# Patient Record
Sex: Female | Born: 1940 | Race: White | Hispanic: No | Marital: Single | Smoking: Never smoker
Health system: Southern US, Community
[De-identification: ages and names within clinical notes are randomized; demographics above are authoritative.]

## PROBLEM LIST (undated history)

## (undated) DIAGNOSIS — I10 Essential (primary) hypertension: Secondary | ICD-10-CM

## (undated) DIAGNOSIS — M1990 Unspecified osteoarthritis, unspecified site: Secondary | ICD-10-CM

## (undated) DIAGNOSIS — E785 Hyperlipidemia, unspecified: Secondary | ICD-10-CM

## (undated) DIAGNOSIS — M3500 Sicca syndrome, unspecified: Secondary | ICD-10-CM

## (undated) HISTORY — PX: CHOLECYSTECTOMY: SHX55

## (undated) HISTORY — PX: ABDOMINAL HYSTERECTOMY: SHX81

## (undated) HISTORY — PX: CORONARY ANGIOPLASTY WITH STENT PLACEMENT: SHX49

---

## 1998-02-20 ENCOUNTER — Ambulatory Visit (HOSPITAL_COMMUNITY): Admission: RE | Admit: 1998-02-20 | Discharge: 1998-02-20 | Payer: Self-pay | Admitting: Obstetrics and Gynecology

## 1998-03-03 ENCOUNTER — Ambulatory Visit (HOSPITAL_COMMUNITY): Admission: RE | Admit: 1998-03-03 | Discharge: 1998-03-03 | Payer: Self-pay | Admitting: Obstetrics and Gynecology

## 2015-12-31 ENCOUNTER — Telehealth: Payer: Self-pay | Admitting: Allergy and Immunology

## 2015-12-31 NOTE — Telephone Encounter (Signed)
Error wrong patient

## 2015-12-31 NOTE — Telephone Encounter (Signed)
Pt called and need to speak to a nurse about the ventolin. [phone].

## 2016-04-24 ENCOUNTER — Encounter: Payer: Self-pay | Admitting: Emergency Medicine

## 2016-04-24 ENCOUNTER — Emergency Department: Payer: Medicare Other

## 2016-04-24 ENCOUNTER — Inpatient Hospital Stay
Admission: EM | Admit: 2016-04-24 | Discharge: 2016-04-27 | DRG: 872 | Disposition: A | Discharge status: Home / Self Care | Payer: Medicare Other | Attending: Internal Medicine | Admitting: Internal Medicine

## 2016-04-24 DIAGNOSIS — L559 Sunburn, unspecified: Secondary | ICD-10-CM | POA: Diagnosis present

## 2016-04-24 DIAGNOSIS — Z1611 Resistance to penicillins: Secondary | ICD-10-CM | POA: Diagnosis present

## 2016-04-24 DIAGNOSIS — M3500 Sicca syndrome, unspecified: Secondary | ICD-10-CM | POA: Diagnosis present

## 2016-04-24 DIAGNOSIS — R531 Weakness: Secondary | ICD-10-CM | POA: Diagnosis present

## 2016-04-24 DIAGNOSIS — A4151 Sepsis due to Escherichia coli [E. coli]: Principal | ICD-10-CM | POA: Diagnosis present

## 2016-04-24 DIAGNOSIS — A419 Sepsis, unspecified organism: Secondary | ICD-10-CM

## 2016-04-24 DIAGNOSIS — N10 Acute tubulo-interstitial nephritis: Secondary | ICD-10-CM | POA: Diagnosis present

## 2016-04-24 DIAGNOSIS — E785 Hyperlipidemia, unspecified: Secondary | ICD-10-CM | POA: Diagnosis present

## 2016-04-24 DIAGNOSIS — I10 Essential (primary) hypertension: Secondary | ICD-10-CM | POA: Diagnosis present

## 2016-04-24 DIAGNOSIS — Z955 Presence of coronary angioplasty implant and graft: Secondary | ICD-10-CM | POA: Diagnosis not present

## 2016-04-24 DIAGNOSIS — M1990 Unspecified osteoarthritis, unspecified site: Secondary | ICD-10-CM | POA: Diagnosis present

## 2016-04-24 DIAGNOSIS — N390 Urinary tract infection, site not specified: Secondary | ICD-10-CM | POA: Diagnosis present

## 2016-04-24 DIAGNOSIS — I2510 Atherosclerotic heart disease of native coronary artery without angina pectoris: Secondary | ICD-10-CM | POA: Diagnosis present

## 2016-04-24 DIAGNOSIS — D72829 Elevated white blood cell count, unspecified: Secondary | ICD-10-CM

## 2016-04-24 HISTORY — DX: Hyperlipidemia, unspecified: E78.5

## 2016-04-24 HISTORY — DX: Sjogren syndrome, unspecified: M35.00

## 2016-04-24 HISTORY — DX: Unspecified osteoarthritis, unspecified site: M19.90

## 2016-04-24 HISTORY — DX: Essential (primary) hypertension: I10

## 2016-04-24 LAB — CBC WITH DIFFERENTIAL/PLATELET
Basophils Absolute: 0.1 10*3/uL (ref 0–0.1)
Basophils Relative: 0 %
EOS ABS: 0.2 10*3/uL (ref 0–0.7)
EOS PCT: 1 %
HCT: 39.4 % (ref 35.0–47.0)
Hemoglobin: 14.1 g/dL (ref 12.0–16.0)
LYMPHS ABS: 2.2 10*3/uL (ref 1.0–3.6)
Lymphocytes Relative: 16 %
MCH: 32.1 pg (ref 26.0–34.0)
MCHC: 35.7 g/dL (ref 32.0–36.0)
MCV: 90.1 fL (ref 80.0–100.0)
MONOS PCT: 7 %
Monocytes Absolute: 1 10*3/uL — ABNORMAL HIGH (ref 0.2–0.9)
Neutro Abs: 9.8 10*3/uL — ABNORMAL HIGH (ref 1.4–6.5)
Neutrophils Relative %: 76 %
PLATELETS: 259 10*3/uL (ref 150–440)
RBC: 4.37 MIL/uL (ref 3.80–5.20)
RDW: 13.5 % (ref 11.5–14.5)
WBC: 13.2 10*3/uL — ABNORMAL HIGH (ref 3.6–11.0)

## 2016-04-24 LAB — URINALYSIS COMPLETE WITH MICROSCOPIC (ARMC ONLY)
BILIRUBIN URINE: NEGATIVE
GLUCOSE, UA: NEGATIVE mg/dL
KETONES UR: NEGATIVE mg/dL
NITRITE: POSITIVE — AB
Protein, ur: 30 mg/dL — AB
SPECIFIC GRAVITY, URINE: 1.009 (ref 1.005–1.030)
pH: 5 (ref 5.0–8.0)

## 2016-04-24 LAB — COMPREHENSIVE METABOLIC PANEL
ALT: 18 U/L (ref 14–54)
AST: 19 U/L (ref 15–41)
Albumin: 4.1 g/dL (ref 3.5–5.0)
Alkaline Phosphatase: 105 U/L (ref 38–126)
Anion gap: 10 (ref 5–15)
BUN: 16 mg/dL (ref 6–20)
CALCIUM: 9.1 mg/dL (ref 8.9–10.3)
CHLORIDE: 105 mmol/L (ref 101–111)
CO2: 25 mmol/L (ref 22–32)
CREATININE: 0.74 mg/dL (ref 0.44–1.00)
Glucose, Bld: 118 mg/dL — ABNORMAL HIGH (ref 65–99)
Potassium: 3.8 mmol/L (ref 3.5–5.1)
Sodium: 140 mmol/L (ref 135–145)
Total Bilirubin: 0.4 mg/dL (ref 0.3–1.2)
Total Protein: 7.8 g/dL (ref 6.5–8.1)

## 2016-04-24 LAB — LACTIC ACID, PLASMA
LACTIC ACID, VENOUS: 0.9 mmol/L (ref 0.5–1.9)
Lactic Acid, Venous: 0.9 mmol/L (ref 0.5–1.9)

## 2016-04-24 MED ORDER — ATORVASTATIN CALCIUM 20 MG PO TABS
40.0000 mg | ORAL_TABLET | Freq: Every day | ORAL | Status: DC
  Administered 2016-04-25 – 2016-04-26 (×2): 40 mg via ORAL
  Filled 2016-04-24 (×2): qty 2

## 2016-04-24 MED ORDER — PANTOPRAZOLE SODIUM 40 MG PO TBEC
40.0000 mg | DELAYED_RELEASE_TABLET | Freq: Every day | ORAL | Status: DC
  Administered 2016-04-25 – 2016-04-27 (×3): 40 mg via ORAL
  Filled 2016-04-24 (×3): qty 1

## 2016-04-24 MED ORDER — CYCLOSPORINE 0.05 % OP EMUL
1.0000 [drp] | Freq: Two times a day (BID) | OPHTHALMIC | Status: DC
  Administered 2016-04-24 – 2016-04-27 (×6): 1 [drp] via OPHTHALMIC
  Filled 2016-04-24 (×6): qty 1

## 2016-04-24 MED ORDER — SODIUM CHLORIDE 0.9% FLUSH
3.0000 mL | Freq: Two times a day (BID) | INTRAVENOUS | Status: DC
  Administered 2016-04-24 – 2016-04-27 (×3): 3 mL via INTRAVENOUS

## 2016-04-24 MED ORDER — HYDROCODONE-ACETAMINOPHEN 5-325 MG PO TABS
1.0000 | ORAL_TABLET | Freq: Once | ORAL | Status: AC
  Administered 2016-04-24: 1 via ORAL
  Filled 2016-04-24: qty 1

## 2016-04-24 MED ORDER — ONDANSETRON HCL 4 MG PO TABS
4.0000 mg | ORAL_TABLET | Freq: Four times a day (QID) | ORAL | Status: DC | PRN
  Administered 2016-04-25: 4 mg via ORAL
  Filled 2016-04-24: qty 1

## 2016-04-24 MED ORDER — DOCUSATE SODIUM 100 MG PO CAPS
100.0000 mg | ORAL_CAPSULE | Freq: Two times a day (BID) | ORAL | Status: DC
Start: 2016-04-24 — End: 2016-04-27
  Administered 2016-04-24 – 2016-04-27 (×6): 100 mg via ORAL
  Filled 2016-04-24 (×6): qty 1

## 2016-04-24 MED ORDER — BISACODYL 10 MG RE SUPP: 10.0000 mg | Freq: Every day | RECTAL | Status: DC | PRN

## 2016-04-24 MED ORDER — ASPIRIN EC 81 MG PO TBEC
81.0000 mg | DELAYED_RELEASE_TABLET | Freq: Every day | ORAL | Status: DC
  Administered 2016-04-25 – 2016-04-27 (×3): 81 mg via ORAL
  Filled 2016-04-24 (×3): qty 1

## 2016-04-24 MED ORDER — ACETAMINOPHEN 650 MG RE SUPP: 650.0000 mg | Freq: Four times a day (QID) | RECTAL | Status: DC | PRN

## 2016-04-24 MED ORDER — AMLODIPINE BESYLATE 5 MG PO TABS
5.0000 mg | ORAL_TABLET | Freq: Every day | ORAL | Status: DC
Start: 2016-04-25 — End: 2016-04-27
  Administered 2016-04-25 – 2016-04-27 (×3): 5 mg via ORAL
  Filled 2016-04-24 (×3): qty 1

## 2016-04-24 MED ORDER — METOPROLOL SUCCINATE ER 50 MG PO TB24
50.0000 mg | ORAL_TABLET | Freq: Every day | ORAL | Status: DC
  Administered 2016-04-25 – 2016-04-27 (×3): 50 mg via ORAL
  Filled 2016-04-24 (×3): qty 1

## 2016-04-24 MED ORDER — ACETAMINOPHEN 325 MG PO TABS
650.0000 mg | ORAL_TABLET | Freq: Once | ORAL | Status: AC | PRN
  Administered 2016-04-24: 650 mg via ORAL
  Filled 2016-04-24: qty 2

## 2016-04-24 MED ORDER — DEXTROSE 5 % IV SOLN
2.0000 g | Freq: Once | INTRAVENOUS | Status: AC
  Administered 2016-04-24: 2 g via INTRAVENOUS
  Filled 2016-04-24: qty 2

## 2016-04-24 MED ORDER — PILOCARPINE HCL 5 MG PO TABS
5.0000 mg | ORAL_TABLET | Freq: Three times a day (TID) | ORAL | Status: DC
  Administered 2016-04-24 – 2016-04-27 (×8): 5 mg via ORAL
  Filled 2016-04-24 (×10): qty 1

## 2016-04-24 MED ORDER — TICAGRELOR 90 MG PO TABS
90.0000 mg | ORAL_TABLET | Freq: Two times a day (BID) | ORAL | Status: DC
  Administered 2016-04-24 – 2016-04-27 (×6): 90 mg via ORAL
  Filled 2016-04-24 (×7): qty 1

## 2016-04-24 MED ORDER — ONDANSETRON HCL 4 MG/2ML IJ SOLN: 4.0000 mg | Freq: Four times a day (QID) | INTRAMUSCULAR | Status: DC | PRN

## 2016-04-24 MED ORDER — HEPARIN SODIUM (PORCINE) 5000 UNIT/ML IJ SOLN
5000.0000 [IU] | Freq: Three times a day (TID) | INTRAMUSCULAR | Status: DC
  Administered 2016-04-24 – 2016-04-27 (×8): 5000 [IU] via SUBCUTANEOUS
  Filled 2016-04-24 (×7): qty 1

## 2016-04-24 MED ORDER — SODIUM CHLORIDE 0.9 % IV SOLN
INTRAVENOUS | Status: DC
  Administered 2016-04-24 – 2016-04-26 (×6): via INTRAVENOUS

## 2016-04-24 MED ORDER — ACETAMINOPHEN 325 MG PO TABS
650.0000 mg | ORAL_TABLET | Freq: Four times a day (QID) | ORAL | Status: DC | PRN
  Administered 2016-04-25 – 2016-04-26 (×3): 650 mg via ORAL
  Filled 2016-04-24 (×3): qty 2

## 2016-04-24 MED ORDER — OXYCODONE HCL 5 MG PO TABS
5.0000 mg | ORAL_TABLET | ORAL | Status: DC | PRN
Start: 2016-04-24 — End: 2016-04-27
  Administered 2016-04-24 – 2016-04-26 (×4): 5 mg via ORAL
  Filled 2016-04-24 (×4): qty 1

## 2016-04-24 NOTE — ED Provider Notes (Signed)
[facility] Emergency Department Provider Note   ____________________________________________    I have reviewed the triage vital signs and the nursing notes.   HISTORY  Chief Complaint Fever and Sunburn     HPI Dawn Calderon is a 75 y.o. female who presents with fevers and chills. She also reports she experienced significant sunburn 3 days ago while at the beach. She first started developing chills yesterday which became much worse overnight. She denies dizziness. No nausea or vomiting. No cough. No shortness of breath. She is visiting family in the area   Past Medical History:  Diagnosis Date  . Arthritis   . Hyperlipemia   . Hypertension   . Sjoegren syndrome (HCC)     There are no active problems to display for this patient.   Past Surgical History:  Procedure Laterality Date  . ABDOMINAL HYSTERECTOMY    . CHOLECYSTECTOMY    . CORONARY ANGIOPLASTY WITH STENT PLACEMENT      Prior to Admission medications   Not on File     Allergies Doxycycline  No family history on file.  Social History Social History  Substance Use Topics  . Smoking status: Never Smoker  . Smokeless tobacco: Never Used  . Alcohol use No    Review of Systems  Constitutional: Severe chills Eyes: No visual changes.  ENT: No sore throat. Cardiovascular: Denies chest pain. Respiratory: Denies shortness of breath. No cough Gastrointestinal: No abdominal pain.  No nausea, no vomiting.   Genitourinary: Negative for dysuria. Musculoskeletal: Negative for back pain. Skin: Sunburn Neurological: Negative for headaches or weakness  10-point ROS otherwise negative.  ____________________________________________   PHYSICAL EXAM:  VITAL SIGNS: ED Triage Vitals  Enc Vitals Group     BP 04/24/16 1741 (!) 158/50     Pulse Rate 04/24/16 1741 93     Resp 04/24/16 1741 20     Temp 04/24/16 1741 (!) 101.9 F (38.8 C)     Temp Source 04/24/16 1741 Oral   SpO2 04/24/16 1741 98 %     Weight 04/24/16 1744 185 lb (83.9 kg)     Height 04/24/16 1744 5' 1.5" (1.562 m)     Head Circumference --      Peak Flow --      Pain Score 04/24/16 1745 8     Pain Loc --      Pain Edu? --      Excl. in GC? --     Constitutional: Alert and oriented. No acute distress. Pleasant and interactive Eyes: Conjunctivae are normal.  Head: Atraumatic. Nose: No congestion/rhinnorhea. Mouth/Throat: Mucous membranes are moist.   Neck:  Painless ROM Cardiovascular: Normal rate, regular rhythm. Grossly normal heart sounds.  Good peripheral circulation. Respiratory: Normal respiratory effort.  No retractions. Lungs CTAB. Gastrointestinal: Soft and nontender. No distention.  No CVA tenderness. Genitourinary: deferred Musculoskeletal: No lower extremity tenderness nor edema.  Warm and well perfused Neurologic:  Normal speech and language. No gross focal neurologic deficits are appreciated.  Skin:  Skin is warm, dry and intact. Erythema consistent with sunburn bilateral dorsal feet, bilateral shoulders and face, most severe on the feet. No blistering. Not consistent with cellulitis Psychiatric: Mood and affect are normal. Speech and behavior are normal.  ____________________________________________   LABS (all labs ordered are listed, but only abnormal results are displayed)  Labs Reviewed  COMPREHENSIVE METABOLIC PANEL - Abnormal; Notable for the following:       Result Value   Glucose, Bld 118 (*)  All other components within normal limits  URINALYSIS COMPLETEWITH MICROSCOPIC ([facility] ONLY) - Abnormal; Notable for the following:    Color, Urine YELLOW (*)    APPearance CLOUDY (*)    Hgb urine dipstick 2+ (*)    Protein, ur 30 (*)    Nitrite POSITIVE (*)    Leukocytes, UA 3+ (*)    Bacteria, UA FEW (*)    Squamous Epithelial / LPF 0-5 (*)    All other components within normal limits  CBC WITH DIFFERENTIAL/PLATELET - Abnormal; Notable for the following:     WBC 13.2 (*)    Neutro Abs 9.8 (*)    Monocytes Absolute 1.0 (*)    All other components within normal limits  URINE CULTURE  CULTURE, BLOOD (ROUTINE X 2)  CULTURE, BLOOD (ROUTINE X 2)  LACTIC ACID, PLASMA  LACTIC ACID, PLASMA   ____________________________________________  EKG  None ____________________________________________  RADIOLOGY Chest x-ray unremarkable ____________________________________________   PROCEDURES  Procedure(s) performed: No    Critical Care performed: No ____________________________________________   INITIAL IMPRESSION / ASSESSMENT AND PLAN / ED COURSE  Pertinent labs & imaging results that were available during my care of the patient were reviewed by me and considered in my medical decision making (see chart for details).  Patient presents with fever and sunburn.We will send urinalysis, check chest x-ray and labs to evaluate for cause of fever  Clinical Course  Urinalysis consistent with UTI, Rocephin ordered. Patient meets sepsis criteria given heart rate and fever and white blood cell count. Lactic acid is normal. I will admit to the hospitalist service ____________________________________________   FINAL CLINICAL IMPRESSION(S) / ED DIAGNOSES  Final diagnoses:  Sepsis, due to unspecified organism (HCC)  [facility])      NEW MEDICATIONS STARTED DURING THIS VISIT:  New Prescriptions   No medications on file     Note:  This document was prepared using Dragon voice recognition software and may include unintentional dictation errors.    Jene Everyobert Jacoria Keiffer, MD 04/24/16 [phone]

## 2016-04-24 NOTE — H&P (Signed)
History and Physical    Dawn GreenhouseLinda Scholer EAV:[id number] DOB: 11/05/1940 DOA: 04/24/2016  Referring physician: Dr. Cyril LoosenKinner PCP: No primary care provider on file.  Specialists: none  Chief Complaint: fever  HPI: Dawn Calderon is a 75 y.o. female has a past medical history significant for ASCVD and Sjogren's syndrome now here with fever and severe sunburn. Found to be febrile and tachycardic with an elevated WBC and abnormal UA c/w sepsis from a urinary source. She is now admitted. Also c/o severe foot pain due to sunburn. Had a coronary stent placed in December. Denies Cp or SOB.  Review of Systems: The patient denies anorexia,  weight loss,, vision loss, decreased hearing, hoarseness, chest pain, syncope, dyspnea on exertion, peripheral edema, balance deficits, hemoptysis, abdominal pain, melena, hematochezia, severe indigestion/heartburn, hematuria, incontinence, genital sores, muscle weakness, suspicious skin lesions, transient blindness, difficulty walking, depression, unusual weight change, abnormal bleeding, enlarged lymph nodes, angioedema, and breast masses.   Past Medical History:  Diagnosis Date  . Arthritis   . Hyperlipemia   . Hypertension   . Sjoegren syndrome [facility](HCC)    Past Surgical History:  Procedure Laterality Date  . ABDOMINAL HYSTERECTOMY    . CHOLECYSTECTOMY    . CORONARY ANGIOPLASTY WITH STENT PLACEMENT     Social History:  reports that she has never smoked. She has never used smokeless tobacco. She reports that she does not drink alcohol or use drugs.  Allergies  Allergen Reactions  . Doxycycline Hives    History reviewed. No pertinent family history.  Prior to Admission medications   Not on File   Physical Exam: Vitals:   04/24/16 1741 04/24/16 1744  BP: (!) 158/50   Pulse: 93   Resp: 20   Temp: (!) 101.9 F (38.8 C)   TempSrc: Oral   SpO2: 98%   Weight:  83.9 kg (185 lb)  Height:  5' 1.5" (1.562 m)     General:  No apparent distress, WDWN,  Henning/AT  Eyes: PERRL, EOMI, no scleral icterus, conjunctiva clear  ENT: moist oropharynx without exudate, TM's benign, dentition good  Neck: supple, no lymphadenopathy. No bruits or thyromegaly  Cardiovascular: regular rate without MRG; 2+ peripheral pulses, no JVD, no peripheral edema  Respiratory: CTA biL, good air movement without wheezing, rhonchi or crackled. Respiratory effort normal  Abdomen: soft, non tender to palpation, positive bowel sounds, no guarding, no rebound  Skin: no rashes or lesions  Musculoskeletal: normal bulk and tone, no joint swelling  Psychiatric: normal mood and affect, A&OX3  Neurologic: CN 2-12 grossly intact, Motor strength 5/5 in all 4 groups with symmetric DTR's and non-focal sensory exam  Labs on Admission:  Basic Metabolic Panel:  Recent Labs Lab 04/24/16 1752  NA 140  K 3.8  CL 105  CO2 25  GLUCOSE 118*  BUN 16  CREATININE 0.74  CALCIUM 9.1   Liver Function Tests:  Recent Labs Lab 04/24/16 1752  AST 19  ALT 18  ALKPHOS 105  BILITOT 0.4  PROT 7.8  ALBUMIN 4.1   No results for input(s): LIPASE, AMYLASE in the last 168 hours. No results for input(s): AMMONIA in the last 168 hours. CBC:  Recent Labs Lab 04/24/16 1752  WBC 13.2*  NEUTROABS 9.8*  HGB 14.1  HCT 39.4  MCV 90.1  PLT 259   Cardiac Enzymes: No results for input(s): CKTOTAL, CKMB, CKMBINDEX, TROPONINI in the last 168 hours.  BNP (last 3 results) No results for input(s): BNP in the last 8760 hours.  ProBNP (  last 3 results) No results for input(s): PROBNP in the last 8760 hours.  CBG: No results for input(s): GLUCAP in the last 168 hours.  Radiological Exams on Admission: Dg Chest 2 View  Result Date: 04/24/2016 CLINICAL DATA:  Fever, chills starting yesterday EXAM: CHEST  2 VIEW COMPARISON:  None. FINDINGS: Cardiomediastinal silhouette is unremarkable. Streaky bilateral basilar atelectasis or scarring. No infiltrate or pulmonary edema. Osteopenia and  degenerative changes thoracic spine. Atherosclerotic calcifications of thoracic aorta. IMPRESSION: No infiltrate or pulmonary edema. Streaky bilateral basilar atelectasis or scarring. Degenerative changes thoracic spine. Electronically Signed   By: Natasha MeadLiviu  Pop M.D.   On: 04/24/2016 18:55    EKG: Independently reviewed.  Assessment/Plan Principal Problem:   Sepsis (HCC) Active Problems:   UTI (lower urinary tract infection)   ASCVD (arteriosclerotic cardiovascular disease)   Sunburn   Will admit to floor with IV fluids and IV ABX. Cultures sent. Pain meds as needed. Repeat labs in AM. Continue outpatient meds  Diet: heart healthy Fluids: NS@100  DVT Prophylaxis: SQ Heparin  Code Status: FULL  Family Communication: none  Disposition Plan: home  Time spent: 50 min

## 2016-04-24 NOTE — ED Triage Notes (Signed)
Pt reports fever since Friday night. Temp of 101 at home. Pt also c/o chills. Pt also states she was at the beach the week and has redness and from sunburns to chest and top of both feet.

## 2016-04-25 LAB — BLOOD CULTURE ID PANEL (REFLEXED)
Acinetobacter baumannii: NOT DETECTED
CANDIDA GLABRATA: NOT DETECTED
CANDIDA KRUSEI: NOT DETECTED
CANDIDA TROPICALIS: NOT DETECTED
CARBAPENEM RESISTANCE: NOT DETECTED
Candida albicans: NOT DETECTED
Candida parapsilosis: NOT DETECTED
ESCHERICHIA COLI: DETECTED — AB
Enterobacter cloacae complex: NOT DETECTED
Enterobacteriaceae species: DETECTED — AB
Enterococcus species: NOT DETECTED
HAEMOPHILUS INFLUENZAE: NOT DETECTED
Klebsiella oxytoca: NOT DETECTED
Klebsiella pneumoniae: NOT DETECTED
LISTERIA MONOCYTOGENES: NOT DETECTED
METHICILLIN RESISTANCE: NOT DETECTED
Neisseria meningitidis: NOT DETECTED
PROTEUS SPECIES: NOT DETECTED
Pseudomonas aeruginosa: NOT DETECTED
SERRATIA MARCESCENS: NOT DETECTED
STREPTOCOCCUS PYOGENES: NOT DETECTED
Staphylococcus aureus (BCID): NOT DETECTED
Staphylococcus species: NOT DETECTED
Streptococcus agalactiae: NOT DETECTED
Streptococcus pneumoniae: NOT DETECTED
Streptococcus species: NOT DETECTED
Vancomycin resistance: NOT DETECTED

## 2016-04-25 LAB — COMPREHENSIVE METABOLIC PANEL
ALBUMIN: 3.6 g/dL (ref 3.5–5.0)
ALK PHOS: 95 U/L (ref 38–126)
ALT: 17 U/L (ref 14–54)
AST: 18 U/L (ref 15–41)
Anion gap: 5 (ref 5–15)
BILIRUBIN TOTAL: 0.5 mg/dL (ref 0.3–1.2)
BUN: 11 mg/dL (ref 6–20)
CALCIUM: 8.7 mg/dL — AB (ref 8.9–10.3)
CO2: 26 mmol/L (ref 22–32)
CREATININE: 0.67 mg/dL (ref 0.44–1.00)
Chloride: 108 mmol/L (ref 101–111)
GFR calc Af Amer: >60 mL/min (ref >60)
GLUCOSE: 105 mg/dL — AB (ref 65–99)
Potassium: 4.1 mmol/L (ref 3.5–5.1)
Sodium: 139 mmol/L (ref 135–145)
TOTAL PROTEIN: 6.9 g/dL (ref 6.5–8.1)

## 2016-04-25 LAB — CBC
HEMATOCRIT: 37.6 % (ref 35.0–47.0)
Hemoglobin: 12.6 g/dL (ref 12.0–16.0)
MCH: 30.8 pg (ref 26.0–34.0)
MCHC: 33.6 g/dL (ref 32.0–36.0)
MCV: 91.8 fL (ref 80.0–100.0)
PLATELETS: 214 10*3/uL (ref 150–440)
RBC: 4.1 MIL/uL (ref 3.80–5.20)
RDW: 13.6 % (ref 11.5–14.5)
WBC: 12.2 10*3/uL — ABNORMAL HIGH (ref 3.6–11.0)

## 2016-04-25 MED ORDER — CALAMINE EX LOTN
TOPICAL_LOTION | CUTANEOUS | Status: DC | PRN
Start: 2016-04-25 — End: 2016-04-27
  Filled 2016-04-25: qty 118

## 2016-04-25 MED ORDER — SILVER SULFADIAZINE 1 % EX CREA
TOPICAL_CREAM | CUTANEOUS | Status: DC | PRN
  Administered 2016-04-25 – 2016-04-26 (×5): via TOPICAL
  Filled 2016-04-25: qty 85

## 2016-04-25 MED ORDER — IBUPROFEN 400 MG PO TABS
400.0000 mg | ORAL_TABLET | Freq: Once | ORAL | Status: DC
  Filled 2016-04-25: qty 1

## 2016-04-25 MED ORDER — SODIUM CHLORIDE 0.9 % IV SOLN
1.0000 g | Freq: Three times a day (TID) | INTRAVENOUS | Status: DC
  Administered 2016-04-25 – 2016-04-27 (×6): 1 g via INTRAVENOUS
  Filled 2016-04-25 (×9): qty 1

## 2016-04-25 NOTE — Progress Notes (Signed)
Pharmacy Antibiotic Note  Dawn GreenhouseLinda Calderon is a 75 y.o. female admitted on 04/24/2016 with bacteremia.  Pharmacy has been consulted for meropenem dosing.  Plan: PHARMACY - PHYSICIAN COMMUNICATION CRITICAL VALUE ALERT - BLOOD CULTURE IDENTIFICATION (BCID)  Results for orders placed or performed during the hospital encounter of 04/24/16  Blood Culture ID Panel (Reflexed) (Collected: 04/24/2016  5:56 PM)  Result Value Ref Range   Enterococcus species NOT DETECTED NOT DETECTED   Vancomycin resistance NOT DETECTED NOT DETECTED   Listeria monocytogenes NOT DETECTED NOT DETECTED   Staphylococcus species NOT DETECTED NOT DETECTED   Staphylococcus aureus NOT DETECTED NOT DETECTED   Methicillin resistance NOT DETECTED NOT DETECTED   Streptococcus species NOT DETECTED NOT DETECTED   Streptococcus agalactiae NOT DETECTED NOT DETECTED   Streptococcus pneumoniae NOT DETECTED NOT DETECTED   Streptococcus pyogenes NOT DETECTED NOT DETECTED   Acinetobacter baumannii NOT DETECTED NOT DETECTED   Enterobacteriaceae species DETECTED (A) NOT DETECTED   Enterobacter cloacae complex NOT DETECTED NOT DETECTED   Escherichia coli DETECTED (A) NOT DETECTED   Klebsiella oxytoca NOT DETECTED NOT DETECTED   Klebsiella pneumoniae NOT DETECTED NOT DETECTED   Proteus species NOT DETECTED NOT DETECTED   Serratia marcescens NOT DETECTED NOT DETECTED   Carbapenem resistance NOT DETECTED NOT DETECTED   Haemophilus influenzae NOT DETECTED NOT DETECTED   Neisseria meningitidis NOT DETECTED NOT DETECTED   Pseudomonas aeruginosa NOT DETECTED NOT DETECTED   Candida albicans NOT DETECTED NOT DETECTED   Candida glabrata NOT DETECTED NOT DETECTED   Candida krusei NOT DETECTED NOT DETECTED   Candida parapsilosis NOT DETECTED NOT DETECTED   Candida tropicalis NOT DETECTED NOT DETECTED    Name of physician (or Provider) Contacted: Dr. Winona LegatoVaickute  Changes to prescribed antibiotics required: ceftriaxone was changed to meropenem [facility]  [facility]  Further narrowing will be determined based on finalized susceptibilities.   Dawn Calderon, PharmD Pharmacy Resident 04/25/2016 9:22 AM

## 2016-04-25 NOTE — Progress Notes (Signed)
18:15 Patient temp 101.7. Tylenol given,.

## 2016-04-25 NOTE — Progress Notes (Signed)
[facility]   PATIENT NAME: Dawn GreenhouseLinda Calderon    MR#:  [id number]  DATE OF BIRTH:  09/27/1940  SUBJECTIVE:  CHIEF COMPLAINT:   Chief Complaint  Patient presents with  . Fever  . Sunburn  The patient is 75 year old Caucasian female who is visiting with her family in the area who presents to the hospital with pain in dorsal aspect of the feet due to sunburn, fevers, chills. She was noted to have fever to 103, leukocytosis, pyuria, concerning for urinary tract infection. Patient was initiated on Rocephin and admitted to the hospital for further evaluation. Blood cultures revealed Enterobacter/Escherichia coli. Patient was changed to meropenem today due to high frequency of ESBL Escherichia coli in the area. She feels better today, she was just initiated on ointment for sunburn  Review of Systems  Constitutional: Negative for chills, fever and weight loss.  HENT: Negative for congestion.   Eyes: Negative for blurred vision and double vision.  Respiratory: Negative for cough, sputum production, shortness of breath and wheezing.   Cardiovascular: Negative for chest pain, palpitations, orthopnea, leg swelling and PND.  Gastrointestinal: Negative for abdominal pain, blood in stool, constipation, diarrhea, nausea and vomiting.  Genitourinary: Negative for dysuria, frequency, hematuria and urgency.  Musculoskeletal: Negative for falls.  Neurological: Negative for dizziness, tremors, focal weakness and headaches.  Endo/Heme/Allergies: Does not bruise/bleed easily.  Psychiatric/Behavioral: Negative for depression. The patient does not have insomnia.     VITAL SIGNS: Blood pressure 110/87, pulse 77, temperature 99.1 F (37.3 C), temperature source Oral, resp. rate 20, height 5' 1.5" (1.562 m), weight 89 kg (196 lb 4.8 oz), SpO2 95 %.  PHYSICAL EXAMINATION:   GENERAL:  75 y.o.-year-old patient lying in the bed with no acute distress.  EYES: Pupils  equal, round, reactive to light and accommodation. No scleral icterus. Extraocular muscles intact.  HEENT: Head atraumatic, normocephalic. Oropharynx and nasopharynx clear.  NECK:  Supple, no jugular venous distention. No thyroid enlargement, no tenderness.  LUNGS: Normal breath sounds bilaterally, no wheezing, rales,rhonchi or crepitation. No use of accessory muscles of respiration.  CARDIOVASCULAR: S1, S2 normal. No murmurs, rubs, or gallops.  ABDOMEN: Soft, nontender, nondistended. Bowel sounds present. No organomegaly or mass.  EXTREMITIES: No pedal edema, cyanosis, or clubbing.  NEUROLOGIC: Cranial nerves II through XII are intact. Muscle strength 5/5 in all extremities. Sensation intact. Gait not checked.  PSYCHIATRIC: The patient is alert and oriented x 3.  SKIN: No obvious rash, lesion, or ulcer. Skin reveals significant severe sunburn in thighs anteriorly,  Anterior chest also bilateral dorsal aspect of the feet  ORDERS/RESULTS REVIEWED:   CBC  Recent Labs Lab 04/24/16 1752 04/25/16 0419  WBC 13.2* 12.2*  HGB 14.1 12.6  HCT 39.4 37.6  PLT 259 214  MCV 90.1 91.8  MCH 32.1 30.8  MCHC 35.7 33.6  RDW 13.5 13.6  LYMPHSABS 2.2  --   MONOABS 1.0*  --   EOSABS 0.2  --   BASOSABS 0.1  --    ------------------------------------------------------------------------------------------------------------------  Chemistries   Recent Labs Lab 04/24/16 1752 04/25/16 0419  NA 140 139  K 3.8 4.1  CL 105 108  CO2 25 26  GLUCOSE 118* 105*  BUN 16 11  CREATININE 0.74 0.67  CALCIUM 9.1 8.7*  AST 19 18  ALT 18 17  ALKPHOS 105 95  BILITOT 0.4 0.5   ------------------------------------------------------------------------------------------------------------------ estimated creatinine clearance is 64.3 mL/min (by C-G formula based on SCr of 0.8 mg/dL). ------------------------------------------------------------------------------------------------------------------ No  results for  input(s): TSH, T4TOTAL, T3FREE, THYROIDAB in the last 72 hours.  Invalid input(s): FREET3  Cardiac Enzymes No results for input(s): CKMB, TROPONINI, MYOGLOBIN in the last 168 hours.  Invalid input(s): CK ------------------------------------------------------------------------------------------------------------------ Invalid input(s): POCBNP ---------------------------------------------------------------------------------------------------------------  RADIOLOGY: Dg Chest 2 View  Result Date: 04/24/2016 CLINICAL DATA:  Fever, chills starting yesterday EXAM: CHEST  2 VIEW COMPARISON:  None. FINDINGS: Cardiomediastinal silhouette is unremarkable. Streaky bilateral basilar atelectasis or scarring. No infiltrate or pulmonary edema. Osteopenia and degenerative changes thoracic spine. Atherosclerotic calcifications of thoracic aorta. IMPRESSION: No infiltrate or pulmonary edema. Streaky bilateral basilar atelectasis or scarring. Degenerative changes thoracic spine. Electronically Signed   By: Natasha MeadLiviu  Pop M.D.   On: 04/24/2016 18:55    EKG: No orders found for this or any previous visit.  ASSESSMENT AND PLAN:  Principal Problem:   Sepsis (HCC) Active Problems:   UTI (lower urinary tract infection)   ASCVD (arteriosclerotic cardiovascular disease)   Sunburn #1. Escherichia coli sepsis due to urinary tract infection, suspected, urine culture is pending, continue patient on meropenem, awaiting for culture sensitivity #2. Urinary tract infection, as above,  Urinary cultures are pending, suspected Escherichia coli, continue antibiotic therapy intravenously, changed to oral whenever sensitivities are back #3. Leukocytosis, no significant improvement with Rocephin, now on meropenem, following the morning #4 sunburn, continue Silvadene daily cream, patient feels more comfortable   Management plans discussed with the patient, family and they are in agreement.   DRUG ALLERGIES:  Allergies   Allergen Reactions  . Doxycycline Other (See Comments)    Burn and itch in vagina area.    CODE STATUS:     Code Status Orders        Start     Ordered   04/24/16 2202  Full code  Continuous     04/24/16 2201    Code Status History    Date Active Date Inactive Code Status Order ID Comments User Context   This patient has a current code status but no historical code status.      TOTAL TIME TAKING CARE OF THIS PATIENT: 40 minutes.    Katharina CaperVAICKUTE,Estevan Kersh M.D on 04/25/2016 at 2:39 PM  Between 7am to 6pm - Pager - [phone]  After 6pm go to [email or web address] - password [email or web address] [facility]  [facility]  Office  [phone]  CC: Primary care physician; No primary care provider on file.

## 2016-04-25 NOTE — Progress Notes (Signed)
17:50 Patient spent day in room - up to bathroom without problem. No complaints re- voiding.IV Antibiotics - Merepenum given.  VS stable. Tele - SR with occasional PVC. Edward JollySilva dine applied to sunburnt areas x2 today. Patient states she feels much improved. No distress noted.

## 2016-04-25 NOTE — Progress Notes (Signed)
Pt febrile 101.5. Notified Dr Elisabeth PigeonVachhani. Received order for ibuprofen 400 mg x 1

## 2016-04-25 NOTE — Progress Notes (Signed)
08:00 Patient very upset this morning - states she was admitted with sunburn, however has not received any treatment for it. Dr. Seth BakeV called, silvadene ointment ordered.

## 2016-04-26 LAB — CBC
HEMATOCRIT: 36.2 % (ref 35.0–47.0)
Hemoglobin: 12.3 g/dL (ref 12.0–16.0)
MCH: 31 pg (ref 26.0–34.0)
MCHC: 34 g/dL (ref 32.0–36.0)
MCV: 91.1 fL (ref 80.0–100.0)
Platelets: 232 10*3/uL (ref 150–440)
RBC: 3.98 MIL/uL (ref 3.80–5.20)
RDW: 13.5 % (ref 11.5–14.5)
WBC: 12.6 10*3/uL — ABNORMAL HIGH (ref 3.6–11.0)

## 2016-04-26 MED ORDER — CALAMINE EX LOTN
TOPICAL_LOTION | Freq: Three times a day (TID) | CUTANEOUS | Status: DC
  Administered 2016-04-26 – 2016-04-27 (×4): via TOPICAL
  Filled 2016-04-26: qty 118

## 2016-04-26 NOTE — Progress Notes (Signed)
Initial Nutrition Assessment  DOCUMENTATION CODES:   Obesity unspecified  INTERVENTION:  -Monitor intake and cater to pt preferences -Discussed diet restrictions briefly. Pt aware of diet restrictions and has met with and RD at cardiac rehab in the past   NUTRITION DIAGNOSIS:    (none at this time) related to   as evidenced by  .    GOAL:   Patient will meet greater than or equal to 90% of their needs    MONITOR:   PO intake  REASON FOR ASSESSMENT:   Malnutrition Screening Tool    ASSESSMENT:      Pt admitted with sunburn feet, fever, chills found to have UTI. Pt reports having heart stents placed in Dec 2016   Past Medical History:  Diagnosis Date  . Arthritis   . Hyperlipemia   . Hypertension   . Sjoegren syndrome ([place])    Pt reports good appetite prior to admission.  " I just came from the beach with my family and ate way to much." " I didn't follow my diet on vacation."  Medications reviewed: colace, NS at 164m/hr  Labs reviewed: glucose 105   Diet Order:  Diet Heart Room service appropriate? Yes; Fluid consistency: Thin  Skin:  Reviewed, no issues  Last BM:  8/21  Height:   Ht Readings from Last 1 Encounters:  04/24/16 5' 1.5" (1.562 m)    Weight: pt reports wt loss but intentional. Eating less and exercising after going to cardiac rehab.  Wt Readings from Last 1 Encounters:  04/24/16 196 lb 4.8 oz (89 kg)    Ideal Body Weight:     BMI:  Body mass index is 36.49 kg/m.  Estimated Nutritional Needs:   Kcal:  1300-1600 kcals/d  Protein:  89-107 g/d  Fluid:  >/=1600 ml/d  EDUCATION NEEDS:   Education needs addressed  Phu Record B. [place](pager) Weekend/On-Call pager ([phone]

## 2016-04-26 NOTE — Progress Notes (Signed)
[facility] -  at [facility]   PATIENT NAME: Dawn GreenhouseLinda Calderon    MR#:  [id number]  DATE OF BIRTH:  01/16/1941  SUBJECTIVE:  CHIEF COMPLAINT:   Chief Complaint  Patient presents with  . Fever  . Sunburn  The patient is 75 year old Caucasian female who is visiting with her family in the area who presents to the hospital with pain in dorsal aspect of the feet due to sunburn, fevers, chills. She was noted to have fever to 103, leukocytosis, pyuria, concerning for urinary tract infection. Patient was initiated on Rocephin and admitted to the hospital for further evaluation. Blood cultures revealed Enterobacter/Escherichia coli. Patient was changed to meropenem today due to high frequency of ESBL Escherichia coli in the area. Patient feels better today, still weak. She is afebrile. Urine, and the blood cultures are still pending. Patient is on meropenem.  Review of Systems  Constitutional: Negative for chills, fever and weight loss.  HENT: Negative for congestion.   Eyes: Negative for blurred vision and double vision.  Respiratory: Negative for cough, sputum production, shortness of breath and wheezing.   Cardiovascular: Negative for chest pain, palpitations, orthopnea, leg swelling and PND.  Gastrointestinal: Negative for abdominal pain, blood in stool, constipation, diarrhea, nausea and vomiting.  Genitourinary: Negative for dysuria, frequency, hematuria and urgency.  Musculoskeletal: Negative for falls.  Neurological: Negative for dizziness, tremors, focal weakness and headaches.  Endo/Heme/Allergies: Does not bruise/bleed easily.  Psychiatric/Behavioral: Negative for depression. The patient does not have insomnia.     VITAL SIGNS: Blood pressure (!) 123/48, pulse 67, temperature 98.3 F (36.8 C), temperature source Oral, resp. rate 19, height 5' 1.5" (1.562 m), weight 89 kg (196 lb 4.8 oz), SpO2 96 %.  PHYSICAL EXAMINATION:   GENERAL:  75 y.o.-year-old  patient lying in the bed with no acute distress.  EYES: Pupils equal, round, reactive to light and accommodation. No scleral icterus. Extraocular muscles intact.  HEENT: Head atraumatic, normocephalic. Oropharynx and nasopharynx clear.  NECK:  Supple, no jugular venous distention. No thyroid enlargement, no tenderness.  LUNGS: Normal breath sounds bilaterally, no wheezing, rales,rhonchi or crepitation. No use of accessory muscles of respiration.  CARDIOVASCULAR: S1, S2 normal. No murmurs, rubs, or gallops.  ABDOMEN: Soft, nontender, nondistended. Bowel sounds present. No organomegaly or mass.  EXTREMITIES: No pedal edema, cyanosis, or clubbing.  NEUROLOGIC: Cranial nerves II through XII are intact. Muscle strength 5/5 in all extremities. Sensation intact. Gait not checked.  PSYCHIATRIC: The patient is alert and oriented x 3.  SKIN: No obvious rash, lesion, or ulcer. Skin reveals significant severe sunburn in thighs anteriorly,  Anterior chest also bilateral dorsal aspect of the feet  ORDERS/RESULTS REVIEWED:   CBC  Recent Labs Lab 04/24/16 1752 04/25/16 0419 04/26/16 0424  WBC 13.2* 12.2* 12.6*  HGB 14.1 12.6 12.3  HCT 39.4 37.6 36.2  PLT 259 214 232  MCV 90.1 91.8 91.1  MCH 32.1 30.8 31.0  MCHC 35.7 33.6 34.0  RDW 13.5 13.6 13.5  LYMPHSABS 2.2  --   --   MONOABS 1.0*  --   --   EOSABS 0.2  --   --   BASOSABS 0.1  --   --    ------------------------------------------------------------------------------------------------------------------  Chemistries   Recent Labs Lab 04/24/16 1752 04/25/16 0419  NA 140 139  K 3.8 4.1  CL 105 108  CO2 25 26  GLUCOSE 118* 105*  BUN 16 11  CREATININE 0.74 0.67  CALCIUM 9.1 8.7*  AST 19 18  ALT 18 17  ALKPHOS 105 95  BILITOT 0.4 0.5   ------------------------------------------------------------------------------------------------------------------ estimated creatinine clearance is 64.3 mL/min (by C-G formula based on SCr of 0.8  mg/dL). ------------------------------------------------------------------------------------------------------------------ No results for input(s): TSH, T4TOTAL, T3FREE, THYROIDAB in the last 72 hours.  Invalid input(s): FREET3  Cardiac Enzymes No results for input(s): CKMB, TROPONINI, MYOGLOBIN in the last 168 hours.  Invalid input(s): CK ------------------------------------------------------------------------------------------------------------------ Invalid input(s): POCBNP ---------------------------------------------------------------------------------------------------------------  RADIOLOGY: Dg Chest 2 View  Result Date: 04/24/2016 CLINICAL DATA:  Fever, chills starting yesterday EXAM: CHEST  2 VIEW COMPARISON:  None. FINDINGS: Cardiomediastinal silhouette is unremarkable. Streaky bilateral basilar atelectasis or scarring. No infiltrate or pulmonary edema. Osteopenia and degenerative changes thoracic spine. Atherosclerotic calcifications of thoracic aorta. IMPRESSION: No infiltrate or pulmonary edema. Streaky bilateral basilar atelectasis or scarring. Degenerative changes thoracic spine. Electronically Signed   By: Natasha MeadLiviu  Pop M.D.   On: 04/24/2016 18:55    EKG: No orders found for this or any previous visit.  ASSESSMENT AND PLAN:  Principal Problem:   Sepsis (HCC) Active Problems:   UTI (lower urinary tract infection)   ASCVD (arteriosclerotic cardiovascular disease)   Sunburn #1. Escherichia coli sepsis due to urinary tract infection,  urine culture reveals Escherichia coli as well, continue patient on meropenem, awaiting for culture sensitivity #2. Urinary tract infection due to  Escherichia coli, continue meropenem therapy intravenously, changed to oral whenever sensitivities are back #3. Leukocytosis, no significant improvement with meropenem, following as outpatient #4 sunburn, continue Silvadene daily cream. Add calamine, patient feels more comfortable today 5.  Generalized weakness, physical therapist evaluation is pending   Management plans discussed with the patient, family and they are in agreement.   DRUG ALLERGIES:  Allergies  Allergen Reactions  . Doxycycline Other (See Comments)    Burn and itch in vagina area.    CODE STATUS:     Code Status Orders        Start     Ordered   04/24/16 2202  Full code  Continuous     04/24/16 2201    Code Status History    Date Active Date Inactive Code Status Order ID Comments User Context   This patient has a current code status but no historical code status.      TOTAL TIME TAKING CARE OF THIS PATIENT: 40 minutes.    Katharina CaperVAICKUTE,Vinton Layson M.D on 04/26/2016 at 2:15 PM  Between 7am to 6pm - Pager - [phone]  After 6pm go to [email or web address] - password [email or web address] [facility]  [facility]  Office  [phone]  CC: Primary care physician; No primary care provider on file.

## 2016-04-26 NOTE — Evaluation (Signed)
Physical Therapy Evaluation Patient Details Name: Dawn Calderon MRN: [id number] DOB: 05/06/1941 Today's Date: 04/26/2016   History of Present Illness  Pt is a 75 yr old female presenting with fever, chills, and sunburn to the chest and dorsal surface of bilat feet. Admitted with sepsis 2/2 UTI. PMH significant for HTN, HLD, arthritis, ASCVD s/p stent placement 12/16.   Clinical Impression  Prior to admission, pt was independent with all functional mobility and ADLs.  Pt lives out of town, and is currently staying with family who live in a two-story home with 3 STE (bedroom/bathroom on 2nd level).  Currently, pt demonstrates independence versus supervision (for IV pole management) with all observed functional mobility, including ambulation x24950ft and navigation of 8 steps.  Pt scored 28/28 on the Tinetti Balance assessment and 12/12 on the modified DGI, indicating she is at a low risk for falls.  Pt is near baseline for functional mobility, limited only by bilat foot pain 2/2 sunburn, and has no acute PT needs. Safe to d/c home when medically appropriate. Will d/c pt in house; please re-consult if pt's status/needs should change.     Follow Up Recommendations No PT follow up    Equipment Recommendations  None recommended by PT    Recommendations for Other Services       Precautions / Restrictions Precautions Precautions: None Restrictions Weight Bearing Restrictions: No      Mobility  Bed Mobility General bed mobility comments: Received in chair; returned to chair. Not assessed.  Transfers Overall transfer level: Independent Equipment used: None General transfer comment: Pt able to perform several repetitions sit <> stand transfers with and without UE assist. No physical assist or cueing required.  Ambulation/Gait Ambulation/Gait assistance: Supervision Ambulation Distance (Feet): 250 Feet Assistive device: None Gait Pattern/deviations: Antalgic Gait velocity: 1.43  ft/sec General Gait Details: Pt ambulates with short, slow, shuffle steps 2/2 bilat feet pain d/t sunburn. No physical assist, verbal cueing, or DME required. Supervision for IV pole management.  Stairs Stairs: Yes Stairs assistance: Supervision Stair Management: Two rails;Step to pattern;Alternating pattern Number of Stairs: 8 General stair comments: Pt demonstrates ability to ascend/descend 8 steps using fluctuating pattern alternating vs step to. Supervision for safety/line management, no physical assist or vc's requried.   Wheelchair Mobility    Modified Rankin (Stroke Patients Only)       Balance Overall balance assessment: Modified Independent Tinetti Balance Assessment was administered and pt scored as follows: Balance sub scale:  16/16 Gait sub scale:  12/12 Total Score:  28/28  No deficits observed  MODIFIED DYNAMIC GAIT INDEX was administered and pt scored as follows: Gait level surface:              3/3 Change in gait speed:   3/3 Gait with horizontal head turns: 3/3 Gait with vertical head turns:  3/3 Total Score:    12/12          Pertinent Vitals/Pain Pain Assessment: 0-10 Pain Score: 8  Pain Location: bilat feet; dorsal surface Pain Descriptors / Indicators: Burning Pain Intervention(s): Limited activity within patient's tolerance;Monitored during session;Repositioned  HR and O2 monitored throughout session and maintained WFL.     Home Living Family/patient expects to be discharged to:: Private residence Living Arrangements: Children Available Help at Discharge: Family Type of Home: House Home Access: Stairs to enter Entrance Stairs-Rails: None Entrance Stairs-Number of Steps: 3 Home Layout: Two level Alternate level stairs- rails: can reach both Alternate level stairs- number of steps: 12 Home Equipment: Dan HumphreysWalker -  2 wheels Additional Comments: Pt from out of town, staying with family until 8/26. Home set-up as detailed above is for the home she  is staying in here. Her permanent home is a Warden/ranger[place]-entry, one story apartment where she lives with her daughter.     Prior Function Level of Independence: Independent  Comments: Pt underwent cardiac rehab 3x/wk x12wks at the beginning of the year, has maintained exercise 3-4x/wk since. Very active, drives.     Extremity/TrunAssessment   Upper Extremity Assessment: Overall WFL for tasks assessed  Lower Extremity Assessment: Overall WFL for tasks assessed  Cervical / Trunk Assessment: Normal    Communication   Communication: No difficulties  Cognition Arousal/Alertness: Awake/alert Behavior During Therapy: WFL for tasks assessed/performed Overall Cognitive Status: Within Functional Limits for tasks assessed    General Comments General comments (skin integrity, edema, etc.): Redness noted on dorsal aspect of bilat feet. Pt willing to don bariatric-sized socks for ambulation.    Exercises Other Exercises Other Exercises: Pt ambulated 27150ft around unit with head turns in all directions and variable gait speed. Supervision for IV pole management.      Assessment/Plan    PT Assessment Patent does not need any further PT services  PT Diagnosis     PT Problem List    PT Treatment Interventions     PT Goals (Current goals can be found in the Care Plan section) Acute Rehab PT Goals Patient Stated Goal: To go home PT Goal Formulation: With patient Time For Goal Achievement: 05/10/16 Potential to Achieve Goals: Good    Frequency     Barriers to discharge           End of Session Equipment Utilized During Treatment: Gait belt Activity Tolerance: Patient tolerated treatment well Patient left: in chair;with call bell/phone within reach (per RN, no chair alarm necessary) Nurse Communication: Mobility status;Precautions         Time: [id number] PT Time Calculation (min) (ACUTE ONLY): 24 min   Charges:         PT G Codes:        Baljit Liebert, SPT 04/26/2016, 12:59  PM

## 2016-04-27 DIAGNOSIS — D72829 Elevated white blood cell count, unspecified: Secondary | ICD-10-CM

## 2016-04-27 DIAGNOSIS — R531 Weakness: Secondary | ICD-10-CM

## 2016-04-27 LAB — URINE CULTURE

## 2016-04-27 LAB — CULTURE, BLOOD (ROUTINE X 2)

## 2016-04-27 MED ORDER — SULFAMETHOXAZOLE-TRIMETHOPRIM 800-160 MG PO TABS: 1.0000 | ORAL_TABLET | Freq: Two times a day (BID) | ORAL | 0 refills | Status: DC

## 2016-04-27 MED ORDER — SILVER SULFADIAZINE 1 % EX CREA: TOPICAL_CREAM | CUTANEOUS | 0 refills | Status: DC | PRN

## 2016-04-27 MED ORDER — SULFAMETHOXAZOLE-TRIMETHOPRIM 800-160 MG PO TABS: 1.0000 | ORAL_TABLET | Freq: Two times a day (BID) | ORAL | 0 refills | Status: AC

## 2016-04-27 MED ORDER — SILVER SULFADIAZINE 1 % EX CREA: TOPICAL_CREAM | CUTANEOUS | 0 refills | Status: AC | PRN

## 2016-04-27 NOTE — Care Management Note (Signed)
Case Management Note  Patient Details  Name: Dawn GreenhouseLinda Seelbach MRN: [id number] Date of Birth: 07/23/1941  Subjective/Objective:     Spoke with patient who is Alert Oriented independent with daughter.   Up walking in room  Without difficulty. Patient is very active and travels frequently. No DME.  Daughter is present to drive patient. Patient plans to return back to her home in [place].  No CM needs Identified.            Action/Plan: Home with self care   Expected Discharge Date:  04/26/16               Expected Discharge Plan:  Home/Self Care  In-House Referral:     Discharge planning Services  CM Consult  Post Acute Care Choice:    Choice offered to:  NA  DME Arranged:    DME Agency:     HH Arranged:    HH Agency:     Status of Service:  Completed, signed off  If discussed at [employer] Length of Stay Meetings, dates discussed:    Additional Comments:  Adonis HugueninBerkhead, Tinamarie Przybylski L, RN 04/27/2016, 12:21 PM

## 2016-04-27 NOTE — Care Management Important Message (Signed)
Important Message  Patient Details  Name: Dawn Calderon MRN: [id number] Date of Birth: 09/06/1940   Medicare Important Message Given:  Yes    Adonis HugueninBerkhead, Terrence Wishon L, RN 04/27/2016, 9:56 AM

## 2016-04-27 NOTE — Discharge Summary (Signed)
[facility] - [place] at [facility]   PATIENT NAME: Dawn Calderon    MR#:  [id number]  DATE OF BIRTH:  1940/11/07  DATE OF ADMISSION:  04/24/2016 ADMITTING PHYSICIAN: Marguarite Arbour, MD  DATE OF DISCHARGE: 04/27/2016 11:15 AM  PRIMARY CARE PHYSICIAN: No primary care provider on file.     ADMISSION DIAGNOSIS:  UTI (lower urinary tract infection) [N39.0] Sepsis, due to unspecified organism (HCC) [A41.9]  DISCHARGE DIAGNOSIS:  Principal Problem:   Escherichia coli sepsis (HCC) Active Problems:   Acute pyelonephritis   Leukocytosis   Generalized weakness   ASCVD (arteriosclerotic cardiovascular disease)   Sunburn   SECONDARY DIAGNOSIS:   Past Medical History:  Diagnosis Date  . Arthritis   . Hyperlipemia   . Hypertension   . Sjoegren syndrome (HCC)     .pro HOSPITAL COURSE:   The patient is 75 year old Caucasian female who is visiting with her family in the area who presents to the hospital with pain in dorsal aspect of the feet due to sunburn, fevers, chills. She was noted to have fever to 103, leukocytosis, pyuria, concerning for urinary tract infection. Patient was initiated on Rocephin and admitted to the hospital for further evaluation. Blood cultures revealed Enterobacter/Escherichia coli. Patient was changed to meropenem today due to high frequency of ESBL Escherichia coli in the area. Sensitivities of Escherichia coli showed, the bacteria is resistant to ampicillin, ampicillin, sulbactam, cefazolin, intermediately sensitive to Pepto tazobactam, sensitive to all other antibiotics including Septra DS. Patient was managed on meropenem while she was in the hospital, however, discharged on Septra DS to complete course. For Sunburn, she was given Silvadene cream with improvement of her condition. She was stable to be discharged home/relative's house in [place]. Discussion by problem: #1. Escherichia coli sepsis due to acute pyelonephritis, blood and   urine culture revealed Escherichia coli , . Patient was continued on meropenem, while in the hospital, she is being discharged on Septra DS, to be taken for 11 more days to complete course  #2. Urinary tract infection due to  Escherichia continue Septra DS, symptoms resolved 3. Leukocytosis,  following as outpatient for complete resolution. #4 sunburn, continue Silvadene daily cream. The  patient is more comfortable today 5. Generalized weakness, physical thevaluated patient and recommended no physical therapist follow-up    DRUG ALLERGIES:   Allergies  Allergen Reactions  . Doxycycline Other (See Comments)    Burn and itch in vagina area.    DISCHARGE MEDICATIONS:   Discharge Medication List as of 04/27/2016 10:19 AM    CONTINUE these medications which have CHANGED   Details  silver sulfADIAZINE (SILVADENE) 1 % cream Apply topically as needed (as needed)., Starting Tue 04/27/2016, Print    sulfamethoxazole-trimethoprim (BACTRIM DS,SEPTRA DS) 800-160 MG tablet Take 1 tablet by mouth 2 (two) times daily., Starting Tue 04/27/2016, Print      CONTINUE these medications which have NOT CHANGED   Details  amLODipine (NORVASC) 5 MG tablet Take 5 mg by mouth every morning., Starting Thu 03/18/2016, Historical Med    aspirin EC 81 MG tablet Take 81 mg by mouth at bedtime., Historical Med    AZASITE 1 % ophthalmic solution Place 1 drop into both eyes every morning., Starting Tue 04/20/2016, Historical Med    BRILINTA 90 MG TABS tablet Take 90 mg by mouth 2 (two) times daily., Starting Fri 04/02/2016, Historical Med    diclofenac sodium (VOLTAREN) 1 % GEL Apply 1 application topically 4 (four) times daily as needed  for pain., Starting Tue 04/20/2016, Historical Med    HYDROcodone-acetaminophen (NORCO) 7.5-325 MG tablet Take 1 tablet by mouth every 8 (eight) hours as needed for pain., Starting Sat 04/03/2016, Historical Med    metoprolol succinate (TOPROL-XL) 50 MG 24 hr tablet Take 50 mg by  mouth every morning., Starting Tue 04/06/2016, Historical Med    metroNIDAZOLE (METROGEL) 0.75 % gel Apply 1 application topically 2 (two) times daily. *Apply to face*, Starting Fri 04/02/2016, Historical Med    pantoprazole (PROTONIX) 40 MG tablet Take 40 mg by mouth every morning., Starting Tue 04/20/2016, Historical Med    pilocarpine (SALAGEN) 5 MG tablet Take 5 mg by mouth 3 (three) times daily., Starting Tue 04/20/2016, Historical Med    RESTASIS 0.05 % ophthalmic emulsion Place 1 drop into both eyes 3 (three) times daily., Starting Tue 04/20/2016, Historical Med    atorvastatin (LIPITOR) 80 MG tablet Take 80 mg by mouth at bedtime., Starting Tue 04/06/2016, Historical Med         The The patient is to follow-up with her primary care physician within 1-2 weeks after dischargepatient is to follow-up with his   If you experience worsening of your admission symptoms, develop shortness of breath, life threatening emergency, suicidal or homicidal thoughts you must seek medical attention immediately by calling 911 or calling your MD immediately  if symptoms less severe.  You Must read complete instructions/literature along with all the possible adverse reactions/side effects for all the Medicines you take and that have been prescribed to you. Take any new Medicines after you have completely understood and accept all the possible adverse reactions/side effects.   Please note  You were cared for by a hospitalist during your hospital stay. If you have any questions about your discharge medications or the care you received while you were in the hospital after you are discharged, you can call the unit and asked to speak with the hospitalist on call if the hospitalist that took care of you is not available. Once you are discharged, your primary care physician will handle any further medical issues. Please note that NO REFILLS for any discharge medications will be authorized once you are discharged, as it  is imperative that you return to your primary care physician (or establish a relationship with a primary care physician if you do not have one) for your aftercare needs so that they can reassess your need for medications and monitor your lab values.    Today   CHIEF COMPLAINT:   Chief Complaint  Patient presents with  . Fever  . Sunburn    HISTORY OF PRESENT ILLNESS:  Dawn Calderon  is a 75 y.o. female with a known history of arthritis, circumflex and syndrome, hypertension, hyperlipidemia, who is visiting with her family in the area who presents to the hospital with pain in dorsal aspect of the feet due to sunburn, fevers, chills. She was noted to have fever to 103, leukocytosis, pyuria, concerning for urinary tract infection. Patient was initiated on Rocephin and admitted to the hospital for further evaluation. Blood cultures revealed Enterobacter/Escherichia coli. Patient was changed to meropenem today due to high frequency of ESBL Escherichia coli in the area. Sensitivities of Escherichia coli showed, the bacteria is resistant to ampicillin, ampicillin, sulbactam, cefazolin, intermediately sensitive to Pepto tazobactam, sensitive to all other antibiotics including Septra DS. Patient was managed on meropenem while she was in the hospital, however, discharged on Septra DS to complete course. For Sunburn, she was given Silvadene cream with  improvement of her condition. She was stable to be discharged home/relative's house in [place]. Discussion by problem: #1. Escherichia coli sepsis due to acute pyelonephritis, blood and  urine culture revealed Escherichia coli , . Patient was continued on meropenem, while in the hospital, she is being discharged on Septra DS, to be taken for 11 more days to complete course  #2. Urinary tract infection due to  Escherichia continue Septra DS, symptoms resolved 3. Leukocytosis,  following as outpatient for complete resolution. #4 sunburn, continue Silvadene  daily cream. The  patient is more comfortable today 5. Generalized weakness, physical thevaluated patient and recommended no physical therapist follow-up   VITAL SIGNS:  Blood pressure 132/65, pulse 83, temperature 98.2 F (36.8 C), temperature source Oral, resp. rate 18, height 5' 1.5" (1.562 m), weight 90.2 kg (198 lb 12.8 oz), SpO2 97 %.  I/O:   Intake/Output Summary (Last 24 hours) at 04/27/16 1559 Last data filed at 04/27/16 0900  Gross per 24 hour  Intake             3080 ml  Output              400 ml  Net             2680 ml    PHYSICAL EXAMINATION:  GENERAL:  75 y.o.-year-old patient lying in the bed with no acute distress.  EYES: Pupils equal, round, reactive to light and accommodation. No scleral icterus. Extraocular muscles intact.  HEENT: Head atraumatic, normocephalic. Oropharynx and nasopharynx clear.  NECK:  Supple, no jugular venous distention. No thyroid enlargement, no tenderness.  LUNGS: Normal breath sounds bilaterally, no wheezing, rales,rhonchi or crepitation. No use of accessory muscles of respiration.  CARDIOVASCULAR: S1, S2 normal. No murmurs, rubs, or gallops.  ABDOMEN: Soft, non-tender, non-distended. Bowel sounds present. No organomegaly or mass.  EXTREMITIES: No pedal edema, cyanosis, or clubbing.  NEUROLOGIC: Cranial nerves II through XII are intact. Muscle strength 5/5 in all extremities. Sensation intact. Gait not checked.  PSYCHIATRIC: The patient is alert and oriented x 3.  SKIN: No obvious rash, lesion, or ulcer.   DATA REVIEW:   CBC  Recent Labs Lab 04/26/16 0424  WBC 12.6*  HGB 12.3  HCT 36.2  PLT 232    Chemistries   Recent Labs Lab 04/25/16 0419  NA 139  K 4.1  CL 108  CO2 26  GLUCOSE 105*  BUN 11  CREATININE 0.67  CALCIUM 8.7*  AST 18  ALT 17  ALKPHOS 95  BILITOT 0.5    Cardiac Enzymes No results for input(s): TROPONINI in the last 168 hours.  Microbiology Results  Results for orders placed or performed during  the hospital encounter of 04/24/16  Urine culture     Status: Abnormal   Collection Time: 04/24/16  5:52 PM  Result Value Ref Range Status   Specimen Description URINE, RANDOM  Final   Special Requests NONE  Final   Culture >=100,000 COLONIES/mL ESCHERICHIA COLI (A)  Final   Report Status 04/27/2016 FINAL  Final   Organism ID, Bacteria ESCHERICHIA COLI (A)  Final      Susceptibility   Escherichia coli - MIC*    AMPICILLIN >=32 RESISTANT Resistant     CEFAZOLIN >=64 RESISTANT Resistant     CEFTRIAXONE <=1 SENSITIVE Sensitive     CIPROFLOXACIN <=0.25 SENSITIVE Sensitive     GENTAMICIN <=1 SENSITIVE Sensitive     IMIPENEM <=0.25 SENSITIVE Sensitive     NITROFURANTOIN <=16 SENSITIVE Sensitive  TRIMETH/SULFA <=20 SENSITIVE Sensitive     AMPICILLIN/SULBACTAM >=32 RESISTANT Resistant     PIP/TAZO 32 INTERMEDIATE Intermediate     Extended ESBL NEGATIVE Sensitive     * >=100,000 COLONIES/mL ESCHERICHIA COLI  Culture, blood (Routine X 2)     Status: Abnormal (Preliminary result)   Collection Time: 04/24/16  5:56 PM  Result Value Ref Range Status   Specimen Description BLOOD RIGHT ANTECUBITAL  Final   Special Requests BOTTLES DRAWN AEROBIC AND ANAEROBIC 14CC  Final   Culture  Setup Time   Final    GRAM NEGATIVE RODS ANAEROBIC BOTTLE ONLY CRITICAL RESULT CALLED TO, READ BACK BY AND VERIFIED WITH: MICHAEL SIMPSON AT 0732 ON 04/25/16.Marland KitchenMarland KitchenMMC    Culture ESCHERICHIA COLI (A)  Final   Report Status PENDING  Incomplete   Organism ID, Bacteria ESCHERICHIA COLI  Final      Susceptibility   Escherichia coli - MIC*    AMPICILLIN >=32 RESISTANT Resistant     CEFAZOLIN >=64 RESISTANT Resistant     CEFEPIME <=1 SENSITIVE Sensitive     CEFTAZIDIME <=1 SENSITIVE Sensitive     CEFTRIAXONE <=1 SENSITIVE Sensitive     CIPROFLOXACIN <=0.25 SENSITIVE Sensitive     GENTAMICIN <=1 SENSITIVE Sensitive     IMIPENEM <=0.25 SENSITIVE Sensitive     TRIMETH/SULFA <=20 SENSITIVE Sensitive      AMPICILLIN/SULBACTAM >=32 RESISTANT Resistant     PIP/TAZO 64 INTERMEDIATE Intermediate     Extended ESBL Value in next row Sensitive      NEGATIVEPerformed at [facility]    * ESCHERICHIA COLI  Blood Culture ID Panel (Reflexed)     Status: Abnormal   Collection Time: 04/24/16  5:56 PM  Result Value Ref Range Status   Enterococcus species NOT DETECTED NOT DETECTED Final   Vancomycin resistance NOT DETECTED NOT DETECTED Final   Listeria monocytogenes NOT DETECTED NOT DETECTED Final   Staphylococcus species NOT DETECTED NOT DETECTED Final   Staphylococcus aureus NOT DETECTED NOT DETECTED Final   Methicillin resistance NOT DETECTED NOT DETECTED Final   Streptococcus species NOT DETECTED NOT DETECTED Final   Streptococcus agalactiae NOT DETECTED NOT DETECTED Final   Streptococcus pneumoniae NOT DETECTED NOT DETECTED Final   Streptococcus pyogenes NOT DETECTED NOT DETECTED Final   Acinetobacter baumannii NOT DETECTED NOT DETECTED Final   Enterobacteriaceae species DETECTED (A) NOT DETECTED Final    Comment: CRITICAL RESULT CALLED TO, READ BACK BY AND VERIFIED WITH: HOLLY GILLAM AT 0906 ON 04/25/16.Marland KitchenMarland KitchenMMC    Enterobacter cloacae complex NOT DETECTED NOT DETECTED Final   Escherichia coli DETECTED (A) NOT DETECTED Final    Comment: CRITICAL RESULT CALLED TO, READ BACK BY AND VERIFIED WITH: HOLLY GILLAM AT 1610 ON 04/25/16.Marland KitchenMarland KitchenMMC    Klebsiella oxytoca NOT DETECTED NOT DETECTED Final   Klebsiella pneumoniae NOT DETECTED NOT DETECTED Final   Proteus species NOT DETECTED NOT DETECTED Final   Serratia marcescens NOT DETECTED NOT DETECTED Final   Carbapenem resistance NOT DETECTED NOT DETECTED Final   Haemophilus influenzae NOT DETECTED NOT DETECTED Final   Neisseria meningitidis NOT DETECTED NOT DETECTED Final   Pseudomonas aeruginosa NOT DETECTED NOT DETECTED Final   Candida albicans NOT DETECTED NOT DETECTED Final   Candida glabrata NOT DETECTED NOT DETECTED Final   Candida krusei NOT  DETECTED NOT DETECTED Final   Candida parapsilosis NOT DETECTED NOT DETECTED Final   Candida tropicalis NOT DETECTED NOT DETECTED Final  Culture, blood (Routine X 2)     Status: None (Preliminary result)  Collection Time: 04/24/16  9:23 PM  Result Value Ref Range Status   Specimen Description BLOOD LEFT ARM  Final   Special Requests BOTTLES DRAWN AEROBIC AND ANAEROBIC 11CC  Final   Culture NO GROWTH 3 DAYS  Final   Report Status PENDING  Incomplete    RADIOLOGY:  No results found.  EKG:  No orders found for this or any previous visit.    Management plans discussed with the patient, family and they are in agreement.  CODE STATUS:     Code Status Orders        Start     Ordered   04/24/16 2202  Full code  Continuous     04/24/16 2201    Code Status History    Date Active Date Inactive Code Status Order ID Comments User Context   This patient has a current code status but no historical code status.      TOTAL TIME TAKING CARE OF THIS PATIENT 40  minutes.    Katharina Caper M.D on 04/27/2016 at 3:59 PM  Between 7am to 6pm - Pager - [phone]  After 6pm go to [email or web address] - password [email or web address] [facility]  [facility]  Office  [phone]  CC: Primary care physician; No primary care provider on file.

## 2016-04-27 NOTE — Progress Notes (Signed)
Pharmacy Antibiotic Note  Dawn GreenhouseLinda Cygan is a 75 y.o. female admitted on 04/24/2016 with bacteremia.  Pharmacy has been consulted for meropenem dosing. Patient is currently on day 4 of antibiotics and day 3 of ceftriaxone.    Plan: Meropenem 1g IV Q8hr. Recommend narrowing based on sensitivities.   Height: 5' 1.5" (156.2 cm) Weight: 198 lb 12.8 oz (90.2 kg) IBW/kg (Calculated) : 48.95  Temp (24hrs), Avg:98.2 F (36.8 C), Min:98 F (36.7 C), Max:98.4 F (36.9 C)   Recent Labs Lab 04/24/16 1752 04/24/16 1909 04/24/16 2123 04/25/16 0419 04/26/16 0424  WBC 13.2*  --   --  12.2* 12.6*  CREATININE 0.74  --   --  0.67  --   LATICACIDVEN  --  0.9 0.9  --   --     Estimated Creatinine Clearance: 64.8 mL/min (by C-G formula based on SCr of 0.8 mg/dL).    Allergies  Allergen Reactions  . Doxycycline Other (See Comments)    Burn and itch in vagina area.    Antimicrobials this admission: ceftriaxone 8/19 >> 8/19 meropenem 8/20 >>  Dose adjustments this admission: N/A  Microbiology results: 8/19 BCx: E.coli 8/19 UCx: E.coli   Pharmacy will continue to monitor and adjust per consult. .  Clydell Sposito L 04/27/2016 10:39 AM

## 2016-04-27 NOTE — Progress Notes (Signed)
Pt being discharged home today, discharge instructions reviewed with pt, all questions answered. She was given a copy of prescription for silvadene cream and bactrim tablets. She was educated to use the cream PRN and to take the anitbiotic in its entirety, and do not skip a dose, she verbalized understanding. No follow up appointment is needed. She will be transported home via family.

## 2016-04-29 LAB — CULTURE, BLOOD (ROUTINE X 2): CULTURE: NO GROWTH

## 2017-03-29 IMAGING — CR DG CHEST 2V
1 series · 2 of 2 positions shown · non-contrast
Comparison: None.

CLINICAL DATA: Fever, chills starting yesterday

EXAM:
CHEST  2 VIEW

[Series 1: dg chest 2 view · 0.14mm/px · 2 of 2 slices shown]
[im 1/2]
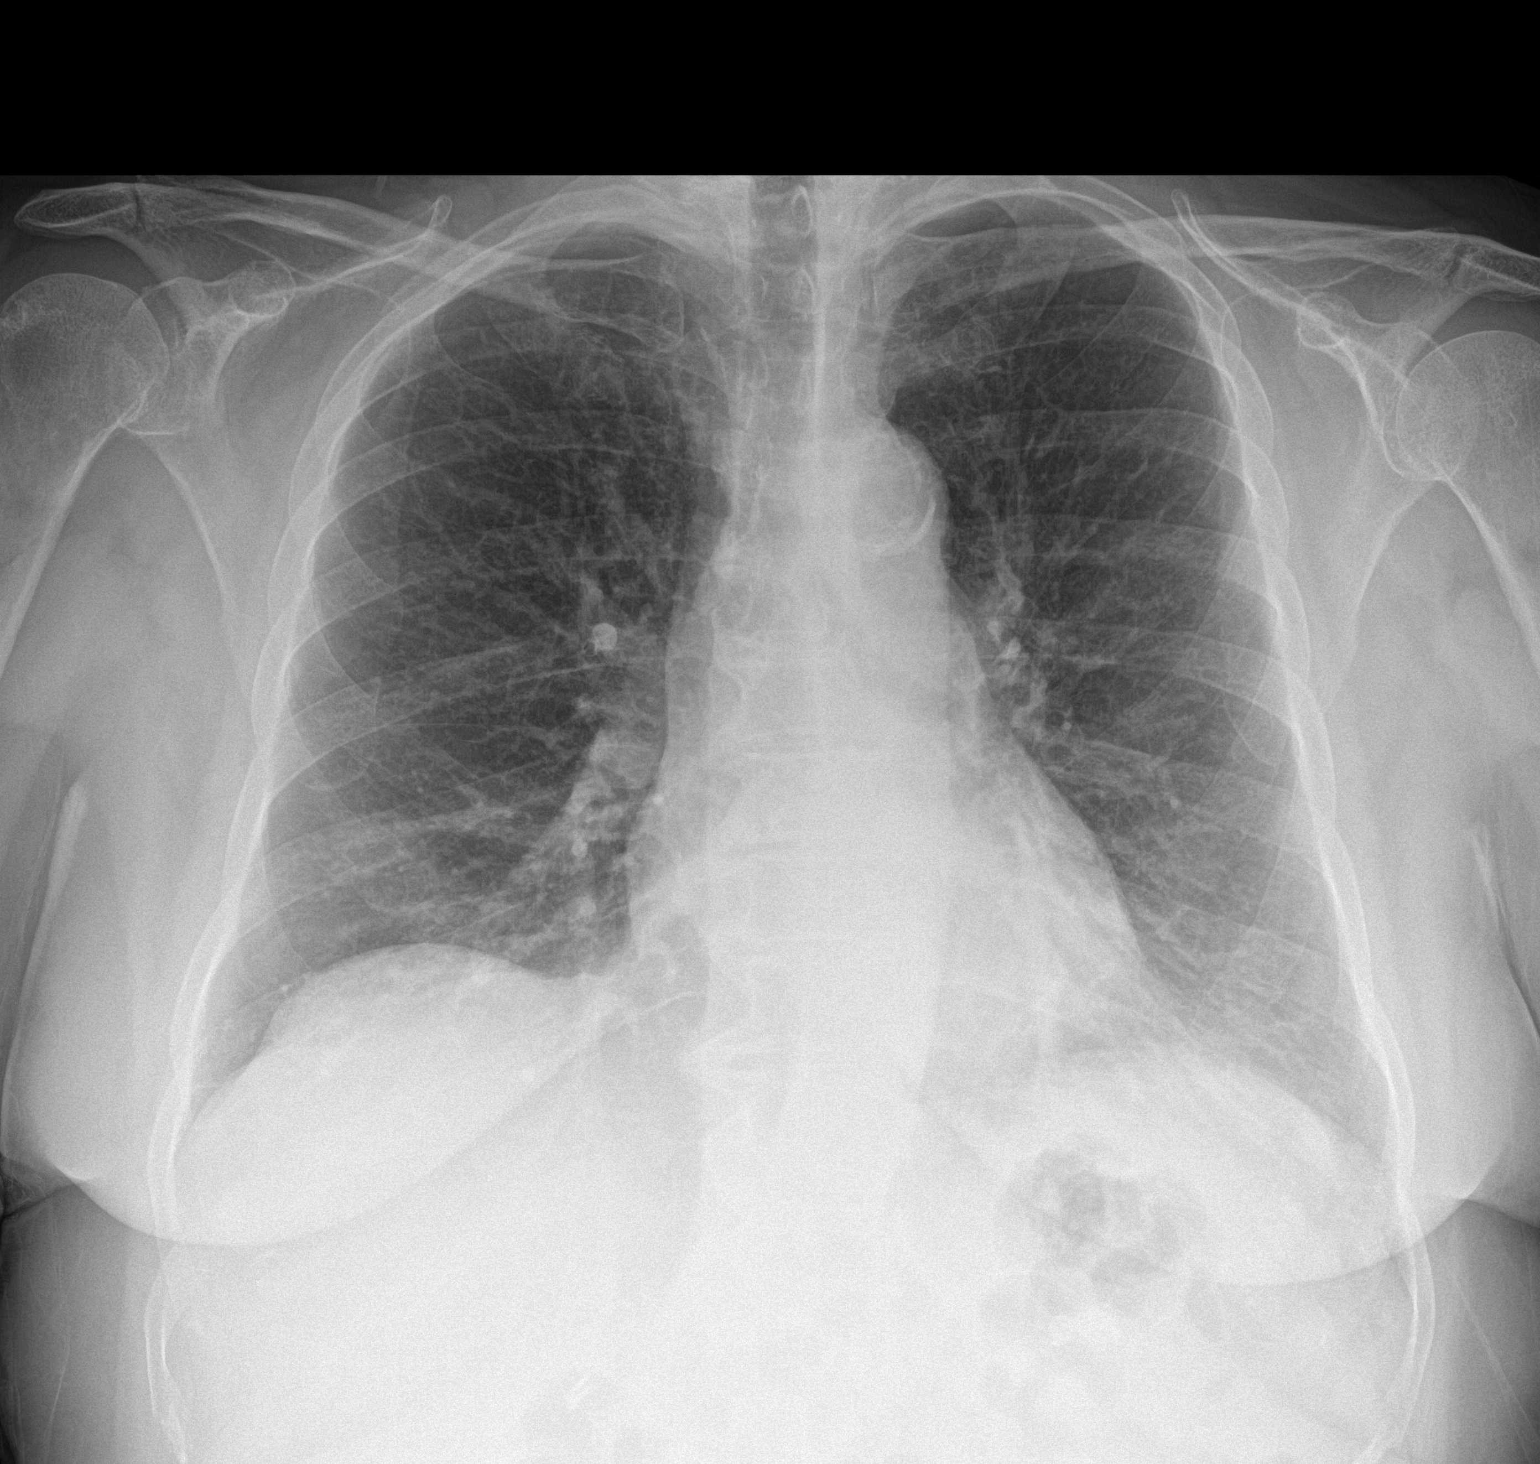
[im 2/2]
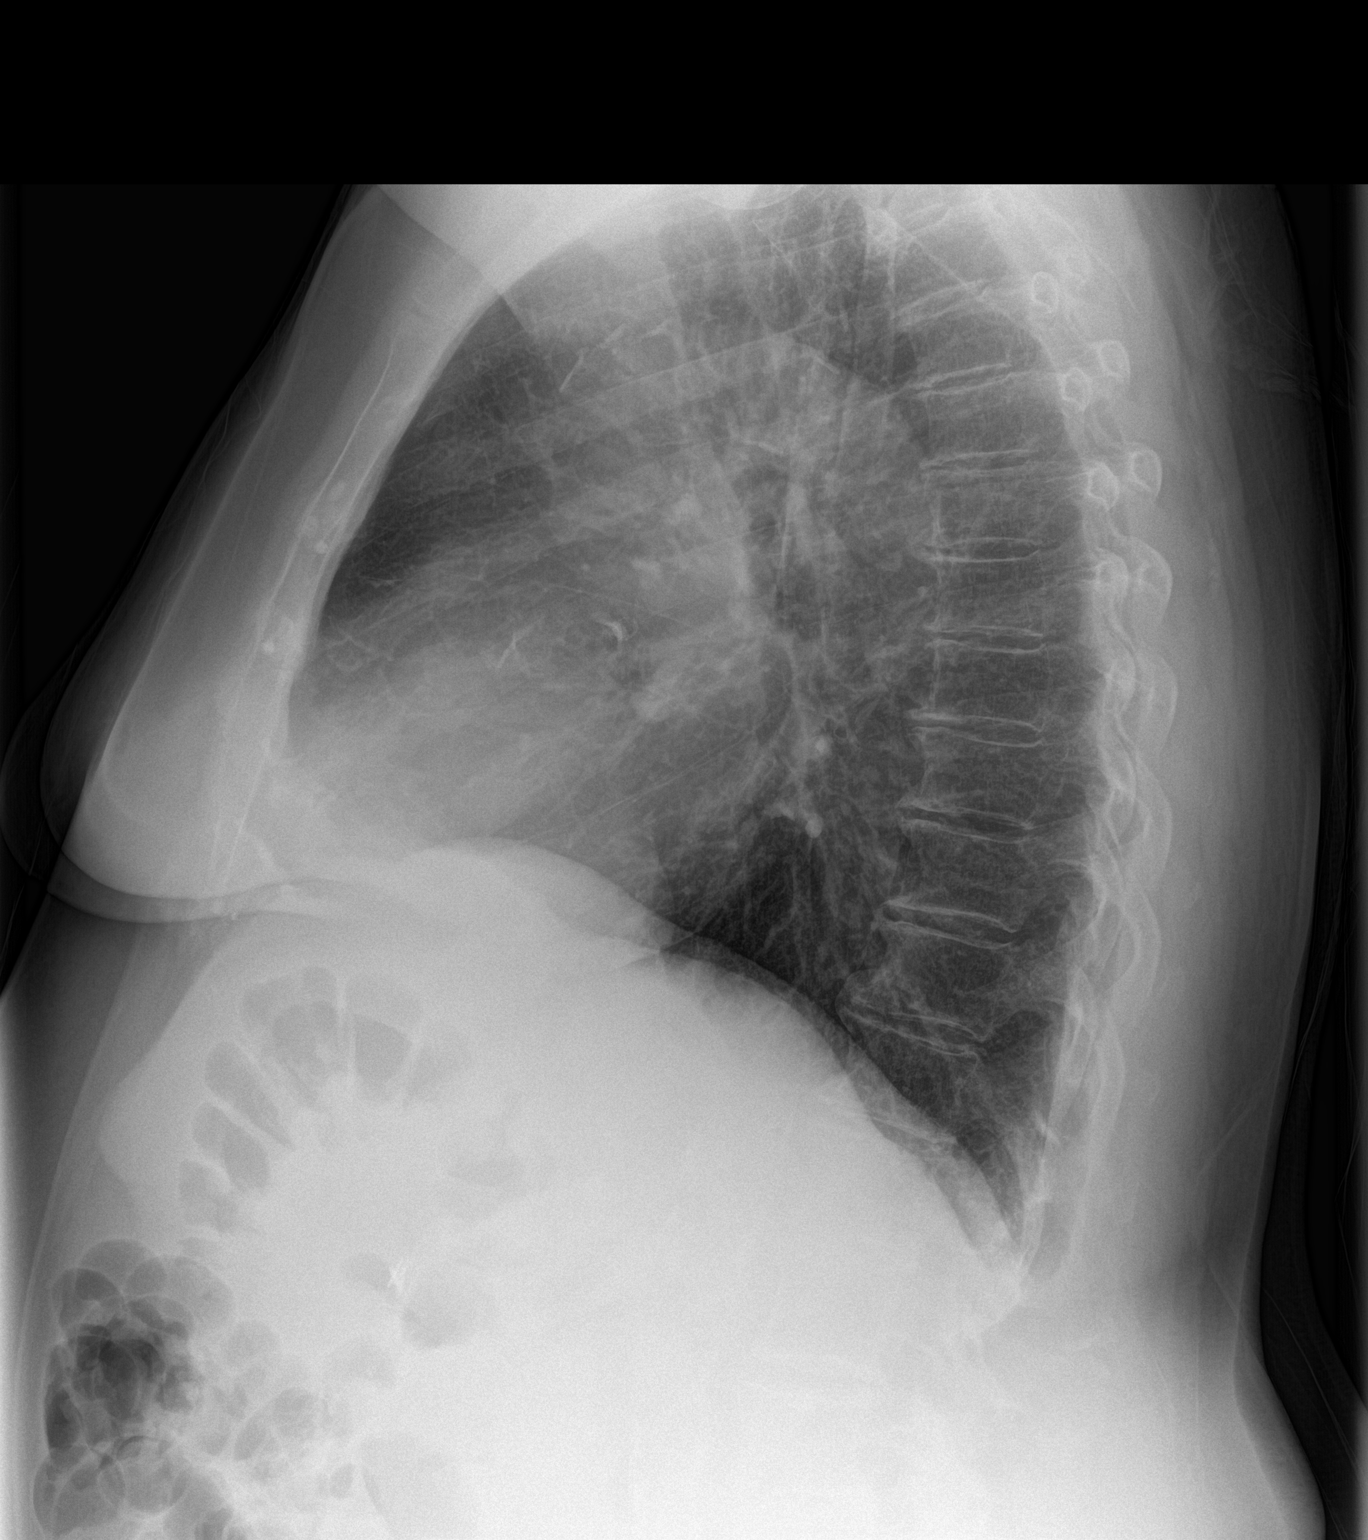

[2 of 2 positions shown; findings below may reference images not displayed]

FINDINGS: Cardiomediastinal silhouette is unremarkable. Streaky bilateral
basilar atelectasis or scarring. No infiltrate or pulmonary edema.
Osteopenia and degenerative changes thoracic spine. Atherosclerotic
calcifications of thoracic aorta.
IMPRESSION: No infiltrate or pulmonary edema. Streaky bilateral basilar
atelectasis or scarring. Degenerative changes thoracic spine.
